# Patient Record
Sex: Female | Born: 1970 | Race: White | Hispanic: No | Marital: Married | Smoking: Current every day smoker
Health system: Southern US, Community
[De-identification: ages and names within clinical notes are randomized; demographics above are authoritative.]

## PROBLEM LIST (undated history)

## (undated) HISTORY — PX: ABDOMINAL HYSTERECTOMY: SHX81

---

## 2014-12-21 ENCOUNTER — Encounter (HOSPITAL_COMMUNITY): Payer: Self-pay

## 2014-12-21 ENCOUNTER — Emergency Department (HOSPITAL_COMMUNITY): Payer: BLUE CROSS/BLUE SHIELD

## 2014-12-21 ENCOUNTER — Emergency Department (HOSPITAL_COMMUNITY)
Admission: EM | Admit: 2014-12-21 | Discharge: 2014-12-22 | Disposition: A | Discharge status: Home / Self Care | Payer: BLUE CROSS/BLUE SHIELD | Attending: Emergency Medicine | Admitting: Emergency Medicine

## 2014-12-21 DIAGNOSIS — Z720 Tobacco use: Secondary | ICD-10-CM | POA: Insufficient documentation

## 2014-12-21 DIAGNOSIS — R079 Chest pain, unspecified: Secondary | ICD-10-CM

## 2014-12-21 DIAGNOSIS — D72829 Elevated white blood cell count, unspecified: Secondary | ICD-10-CM | POA: Insufficient documentation

## 2014-12-21 DIAGNOSIS — R590 Localized enlarged lymph nodes: Secondary | ICD-10-CM | POA: Insufficient documentation

## 2014-12-21 DIAGNOSIS — R0789 Other chest pain: Secondary | ICD-10-CM | POA: Diagnosis not present

## 2014-12-21 DIAGNOSIS — R591 Generalized enlarged lymph nodes: Secondary | ICD-10-CM

## 2014-12-21 DIAGNOSIS — Z7982 Long term (current) use of aspirin: Secondary | ICD-10-CM | POA: Diagnosis not present

## 2014-12-21 LAB — CBC WITH DIFFERENTIAL/PLATELET
Basophils Absolute: 0 10*3/uL (ref 0.0–0.1)
Basophils Relative: 0 % (ref 0–1)
Eosinophils Absolute: 0.1 10*3/uL (ref 0.0–0.7)
Eosinophils Relative: 1 % (ref 0–5)
HEMATOCRIT: 41.9 % (ref 36.0–46.0)
HEMOGLOBIN: 14.2 g/dL (ref 12.0–15.0)
LYMPHS PCT: 24 % (ref 12–46)
Lymphs Abs: 4.4 10*3/uL — ABNORMAL HIGH (ref 0.7–4.0)
MCH: 28.8 pg (ref 26.0–34.0)
MCHC: 33.9 g/dL (ref 30.0–36.0)
MCV: 85 fL (ref 78.0–100.0)
MONOS PCT: 4 % (ref 3–12)
Monocytes Absolute: 0.8 10*3/uL (ref 0.1–1.0)
Neutro Abs: 13.1 10*3/uL — ABNORMAL HIGH (ref 1.7–7.7)
Neutrophils Relative %: 71 % (ref 43–77)
Platelets: 352 10*3/uL (ref 150–400)
RBC: 4.93 MIL/uL (ref 3.87–5.11)
RDW: 13.5 % (ref 11.5–15.5)
WBC: 18.5 10*3/uL — AB (ref 4.0–10.5)

## 2014-12-21 LAB — TROPONIN I

## 2014-12-21 LAB — BASIC METABOLIC PANEL
Anion gap: 7 (ref 5–15)
BUN: 17 mg/dL (ref 6–23)
CO2: 25 mmol/L (ref 19–32)
Calcium: 8.9 mg/dL (ref 8.4–10.5)
Chloride: 104 mmol/L (ref 96–112)
Creatinine, Ser: 0.99 mg/dL (ref 0.50–1.10)
GFR calc Af Amer: 80 mL/min — ABNORMAL LOW (ref >90)
GFR calc non Af Amer: 69 mL/min — ABNORMAL LOW (ref >90)
GLUCOSE: 101 mg/dL — AB (ref 70–99)
Potassium: 4.9 mmol/L (ref 3.5–5.1)
Sodium: 136 mmol/L (ref 135–145)

## 2014-12-21 MED ORDER — SODIUM CHLORIDE 0.9 % IV SOLN
INTRAVENOUS | Status: DC
Start: 2014-12-21 — End: 2014-12-22
  Administered 2014-12-21: 20 mL/h via INTRAVENOUS

## 2014-12-21 NOTE — ED Notes (Signed)
EKG given to EDP,Allen,MD., for review. 

## 2014-12-21 NOTE — ED Notes (Signed)
Patient reports she has recurrent episodes of sharp chest pain.  Today she has had several episodes of sharp left-sided chest pain with radiation into neck and down left arm.

## 2014-12-22 LAB — TROPONIN I

## 2014-12-22 LAB — D-DIMER, QUANTITATIVE: D-Dimer, Quant: <0.27 ug/mL-FEU (ref 0.00–0.48)

## 2014-12-22 NOTE — Discharge Instructions (Signed)
Your workup for your chest pain did not show a specific cause today.  It was noticed that she had some swollen lymph nodes along her left neck and in your left armpit.  This is most likely reactive to the recent puppy bite and scratches to your left arm.  Your white blood cell count was also elevated.  No acute infection was noted to the scratches.  We are not treating with antibiotics at this time.  Please follow-up next week with your primary care doctor for reevaluation of these findings.    Chest Pain Observation It is often hard to give a specific diagnosis for the cause of chest pain. Among other possibilities your symptoms might be caused by inadequate oxygen delivery to your heart (angina). Angina that is not treated or evaluated can lead to a heart attack (myocardial infarction) or death. Blood tests, electrocardiograms, and X-rays may have been done to help determine a possible cause of your chest pain. After evaluation and observation, your health care provider has determined that it is unlikely your pain was caused by an unstable condition that requires hospitalization. However, a full evaluation of your pain may need to be completed, with additional diagnostic testing as directed. It is very important to keep your follow-up appointments. Not keeping your follow-up appointments could result in permanent heart damage, disability, or death. If there is any problem keeping your follow-up appointments, you must call your health care provider. HOME CARE INSTRUCTIONS  Due to the slight chance that your pain could be angina, it is important to follow your health care provider's treatment plan and also maintain a healthy lifestyle:  Maintain or work toward achieving a healthy weight.  Stay physically active and exercise regularly.  Decrease your salt intake.  Eat a balanced, healthy diet. Talk to a dietitian to learn about heart-healthy foods.  Increase your fiber intake by including whole  grains, vegetables, fruits, and nuts in your diet.  Avoid situations that cause stress, anger, or depression.  Take medicines as advised by your health care provider. Report any side effects to your health care provider. Do not stop medicines or adjust the dosages on your own.  Quit smoking. Do not use nicotine patches or gum until you check with your health care provider.  Keep your blood pressure, blood sugar, and cholesterol levels within normal limits.  Limit alcohol intake to no more than 1 drink per day for women who are not pregnant and 2 drinks per day for men.  Do not abuse drugs. SEEK IMMEDIATE MEDICAL CARE IF: You have severe chest pain or pressure which may include symptoms such as:  You feel pain or pressure in your arms, neck, jaw, or back.  You have severe back or abdominal pain, feel sick to your stomach (nauseous), or throw up (vomit).  You are sweating profusely.  You are having a fast or irregular heartbeat.  You feel short of breath while at rest.  You notice increasing shortness of breath during rest, sleep, or with activity.  You have chest pain that does not get better after rest or after taking your usual medicine.  You wake from sleep with chest pain.  You are unable to sleep because you cannot breathe.  You develop a frequent cough or you are coughing up blood.  You feel dizzy, faint, or experience extreme fatigue.  You develop severe weakness, dizziness, fainting, or chills. Any of these symptoms may represent a serious problem that is an emergency. Do not wait  to see if the symptoms will go away. Call your local emergency services (911 in the [place] Do not drive yourself to the hospital. MAKE SURE YOU:  Understand these instructions.  Will watch your condition.  Will get help right away if you are not doing well or get worse. Document Released: 12/06/2010 Document Revised: 11/08/2013 Document Reviewed: 05/05/2013 [facility] Patient  Information 2015 [place], [place]. This information is not intended to replace advice given to you by your health care provider. Make sure you discuss any questions you have with your health care provider.  Lymphadenopathy Lymphadenopathy means "disease of the lymph glands." But the term is usually used to describe swollen or enlarged lymph glands, also called lymph nodes. These are the bean-shaped organs found in many locations including the neck, underarm, and groin. Lymph glands are part of the immune system, which fights infections in your body. Lymphadenopathy can occur in just one area of the body, such as the neck, or it can be generalized, with lymph node enlargement in several areas. The nodes found in the neck are the most common sites of lymphadenopathy. CAUSES When your immune system responds to germs (such as viruses or bacteria ), infection-fighting cells and fluid build up. This causes the glands to grow in size. Usually, this is not something to worry about. Sometimes, the glands themselves can become infected and inflamed. This is called lymphadenitis. Enlarged lymph nodes can be caused by many diseases:  Bacterial disease, such as strep throat or a skin infection.  Viral disease, such as a common cold.  Other germs, such as Lyme disease, tuberculosis, or sexually transmitted diseases.  Cancers, such as lymphoma (cancer of the lymphatic system) or leukemia (cancer of the white blood cells).  Inflammatory diseases such as lupus or rheumatoid arthritis.  Reactions to medications. Many of the diseases above are rare, but important. This is why you should see your caregiver if you have lymphadenopathy. SYMPTOMS  Swollen, enlarged lumps in the neck, back of the head, or other locations.  Tenderness.  Warmth or redness of the skin over the lymph nodes.  Fever. DIAGNOSIS Enlarged lymph nodes are often near the source of infection. They can help health care providers diagnose  your illness. For instance:  Swollen lymph nodes around the jaw might be caused by an infection in the mouth.  Enlarged glands in the neck often signal a throat infection.  Lymph nodes that are swollen in more than one area often indicate an illness caused by a virus. Your caregiver will likely know what is causing your lymphadenopathy after listening to your history and examining you. Blood tests, x-rays, or other tests may be needed. If the cause of the enlarged lymph node cannot be found, and it does not go away by itself, then a biopsy may be needed. Your caregiver will discuss this with you. TREATMENT Treatment for your enlarged lymph nodes will depend on the cause. Many times the nodes will shrink to normal size by themselves, with no treatment. Antibiotics or other medicines may be needed for infection. Only take over-the-counter or prescription medicines for pain, discomfort, or fever as directed by your caregiver. HOME CARE INSTRUCTIONS Swollen lymph glands usually return to normal when the underlying medical condition goes away. If they persist, contact your health-care provider. He/she might prescribe antibiotics or other treatments, depending on the diagnosis. Take any medications exactly as prescribed. Keep any follow-up appointments made to check on the condition of your enlarged nodes. SEEK MEDICAL CARE IF:  Swelling lasts for more than two weeks.  You have symptoms such as weight loss, night sweats, fatigue, or fever that does not go away.  The lymph nodes are hard, seem fixed to the skin, or are growing rapidly.  Skin over the lymph nodes is red and inflamed. This could mean there is an infection. SEEK IMMEDIATE MEDICAL CARE IF:  Fluid starts leaking from the area of the enlarged lymph node.  You develop a fever of 102 F (38.9 C) or greater.  Severe pain develops (not necessarily at the site of a large lymph node).  You develop chest pain or shortness of breath.  You  develop worsening abdominal pain. MAKE SURE YOU:  Understand these instructions.  Will watch your condition.  Will get help right away if you are not doing well or get worse. Document Released: 08/12/2008 Document Revised: 03/20/2014 Document Reviewed: 08/12/2008 [facility] Patient Information 2015 [place], [place]. This information is not intended to replace advice given to you by your health care provider. Make sure you discuss any questions you have with your health care provider.  Leukocytosis Leukocytosis means you have more white blood cells than normal. White blood cells are made in your bone marrow. The main job of white blood cells is to fight infection. Having too many white blood cells is a common condition. It can develop as a result of many types of medical problems. CAUSES  In some cases, your bone marrow may be normal, but it is still making too many white blood cells. This could be the result of:  Infection.  Injury.  Physical stress.  Emotional stress.  Surgery.  Allergic reactions.  Tumors that do not start in the blood or bone marrow.  An inherited disease.  Certain medicines.  Pregnancy and labor. In other cases, you may have a bone marrow disorder that is causing your body to make too many white blood cells. Bone marrow disorders include:  Leukemia. This is a type of blood cancer.  Myeloproliferative disorders. These disorders cause blood cells to grow abnormally. SYMPTOMS  Some people have no symptoms. Others have symptoms due to the medical problem that is causing their leukocytosis. These symptoms may include:  Bleeding.  Bruising.  Fever.  Night sweats.  Repeated infections.  Weakness.  Weight loss. DIAGNOSIS  Leukocytosis is often found during blood tests that are done as part of a normal physical exam. Your caregiver will probably order other tests to help determine why you have too many white blood cells. These tests may include:  A  complete blood count (CBC). This test measures all the types of blood cells in your body.  Chest X-rays, urine tests (urinalysis), or other tests to look for signs of infection.  Bone marrow aspiration. For this test, a needle is put into your bone. Cells from the bone marrow are removed through the needle. The cells are then examined under a microscope. TREATMENT  Treatment is usually not needed for leukocytosis. However, if a disorder is causing your leukocytosis, it will need to be treated. Treatment may include:  Antibiotic medicines if you have a bacterial infection.  Bone marrow transplant. Your diseased bone marrow is replaced with healthy cells that will grow new bone marrow.  Chemotherapy. This is the use of drugs to kill cancer cells. HOME CARE INSTRUCTIONS  Only take over-the-counter or prescription medicines as directed by your caregiver.  Maintain a healthy weight. Ask your caregiver what weight is best for you.  Eat foods that are  low in saturated fats and high in fiber. Eat plenty of fruits and vegetables.  Drink enough fluids to keep your urine clear or pale yellow.  Get 30 minutes of exercise at least 5 times a week. Check with your caregiver before starting a new exercise routine.  Limit caffeine and alcohol.  Do not smoke.  Keep all follow-up appointments as directed by your caregiver. SEEK MEDICAL CARE IF:  You feel weak or more tired than usual.  You develop chills, a cough, or nasal congestion.  You lose weight without trying.  You have night sweats.  You bruise easily. SEEK IMMEDIATE MEDICAL CARE IF:  You bleed more than normal.  You have chest pain.  You have trouble breathing.  You have a fever.  You have uncontrolled nausea or vomiting.  You feel dizzy or lightheaded. MAKE SURE YOU:  Understand these instructions.  Will watch your condition.  Will get help right away if you are not doing well or get worse. Document Released:  10/23/2011 Document Revised: 01/26/2012 Document Reviewed: 10/23/2011 [facility] Patient Information 2015 [place], [place]. This information is not intended to replace advice given to you by your health care provider. Make sure you discuss any questions you have with your health care provider.

## 2014-12-22 NOTE — ED Provider Notes (Signed)
CSN: [id number]     Arrival date & time 12/21/14  2210 History   First MD Initiated Contact with Patient 12/21/14 2307     Chief Complaint  Patient presents with  . Chest Pain     (Consider location/radiation/quality/duration/timing/severity/associated sxs/prior Treatment) HPI 44 year old female presents to the emergency department with complaint of left-sided chest pain.  She reports the pain is sharp, lasting a few seconds of the time.  It is gone to the left side her neck and down her left arm.  She denies any diaphoresis, shortness of breath, nausea with this pain.  Symptoms currently resolved.  Lasted about 2 hours total.  Patient reports she has had similar problems in the past, and had workup that showed fast heart rate.  She was unable to do a stress test secondary to tachycardia.  She denies any family history of coronary disease.  She is a smoker.  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  Patient noted to have scratches to her arms, left greater than right.  She reports that she has 3 puppies that have been scratching her.  She denies any fever, chills, rashes, or swelling. History reviewed. No pertinent past medical history. Past Surgical History  Procedure Laterality Date  . Abdominal hysterectomy     History reviewed. No pertinent family history. History  Substance Use Topics  . Smoking status: Current Every Day Smoker -- 1.00 packs/day    Types: Cigarettes  . Smokeless tobacco: Not on file  . Alcohol Use: No   OB History    No data available     Review of Systems   See History of Present Illness; otherwise all other systems are reviewed and negative  Allergies  Review of patient's allergies indicates no known allergies.  Home Medications   Prior to Admission medications   Medication Sig Start Date End Date Taking? Authorizing Provider  aspirin EC 81 MG tablet Take 81 mg by mouth daily.   Yes Historical Provider, MD   BP 128/76 mmHg  Pulse 88   Temp(Src) 97.9 F (36.6 C) (Oral)  Resp 22  Ht 5\' 7"  (1.702 m)  Wt 198 lb (89.812 kg)  BMI 31.00 kg/m2  SpO2 97% Physical Exam  Constitutional: She is oriented to person, place, and time. She appears well-developed and well-nourished. No distress.  HENT:  Head: Normocephalic and atraumatic.  Nose: Nose normal.  Mouth/Throat: Oropharynx is clear and moist.  Eyes: Conjunctivae and EOM are normal. Pupils are equal, round, and reactive to light.  Neck: Normal range of motion. Neck supple. No JVD present. No tracheal deviation present. No thyromegaly present.  Cardiovascular: Normal rate, regular rhythm, normal heart sounds and intact distal pulses.  Exam reveals no gallop and no friction rub.   No murmur heard. Pulmonary/Chest: Effort normal and breath sounds normal. No stridor. No respiratory distress. She has no wheezes. She has no rales. She exhibits no tenderness.  Abdominal: Soft. Bowel sounds are normal. She exhibits no distension and no mass. There is no tenderness. There is no rebound and no guarding.  Musculoskeletal: Normal range of motion. She exhibits no edema or tenderness.  Patient has several scratches to both arms, 3 or 4 to the left arm with some mild surrounding erythema without purulence, crepitus, or frank cellulitis.  Lymphadenopathy:    She has cervical adenopathy (patient has posterior chain lymphadenopathy along the left SCM.  She also has left axillary lymphadenopathy).  Neurological: She is alert and oriented  to person, place, and time. She displays normal reflexes. She exhibits normal muscle tone. Coordination normal.  Skin: Skin is warm and dry. No rash noted. No erythema. No pallor.  Psychiatric: She has a normal mood and affect. Her behavior is normal. Judgment and thought content normal.  Nursing note and vitals reviewed.   ED Course  Procedures (including critical care time) Labs Review Labs Reviewed  CBC WITH DIFFERENTIAL/PLATELET - Abnormal; Notable  for the following:    WBC 18.5 (*)    Neutro Abs 13.1 (*)    Lymphs Abs 4.4 (*)    All other components within normal limits  BASIC METABOLIC PANEL - Abnormal; Notable for the following:    Glucose, Bld 101 (*)    GFR calc non Af Amer 69 (*)    GFR calc Af Amer 80 (*)    All other components within normal limits  TROPONIN I  TROPONIN I  D-DIMER, QUANTITATIVE    Imaging Review Dg Chest 2 View  12/21/2014   CLINICAL DATA:  Recurrent sharp chest pain, on the left side, radiating to the left arm and neck.  EXAM: CHEST  2 VIEW  COMPARISON:  None.  FINDINGS: The lungs are well-aerated. Minimal bibasilar atelectasis is suggested. There is no evidence of pleural effusion or pneumothorax.  The heart is normal in size; the mediastinal contour is within normal limits. No acute osseous abnormalities are seen.  IMPRESSION: Minimal bibasilar atelectasis suggested; lungs otherwise clear. No displaced rib fracture seen.   Electronically Signed   By: Roanna Raider M.D.   On: 12/21/2014 22:52     EKG Interpretation   Date/Time:  Thursday December 21 2014 22:49:53 EST Ventricular Rate:  81 PR Interval:  155 QRS Duration: 103 QT Interval:  379 QTC Calculation: 440 R Axis:   85 Text Interpretation:  Sinus rhythm No old tracing to compare Confirmed by  Jahnay Lantier  MD, Dari Carpenito ([id number]) on 12/21/2014 11:19:03 PM      MDM   Final diagnoses:  Atypical chest pain  Lymphadenopathy  Leukocytosis    43 year old female with chest pain that is sharp and brief in nature.  Pain by the patient's description is very atypical for ACS.  D-dimer is negative, and again pain does not seem consistent with a PE.  She was noted to have some lymphadenopathy in leukocytosis.  This may be secondary to reactive changes from dog bites and scratches.  She does not appear to have any active skin infections.  We'll hold on antibiotics at this time.  Patient reports that she has follow-up with her doctor scheduled for next  Wednesday.    Olivia Mackie, MD 12/22/14 Earle [facility]ell

## 2015-08-26 IMAGING — CR DG CHEST 2V
2 series · 2 of 2 positions shown · non-contrast
Comparison: None.

CLINICAL DATA: Recurrent sharp chest pain, on the left side,
radiating to the left arm and neck.

EXAM:
CHEST  2 VIEW

[w chest pa]
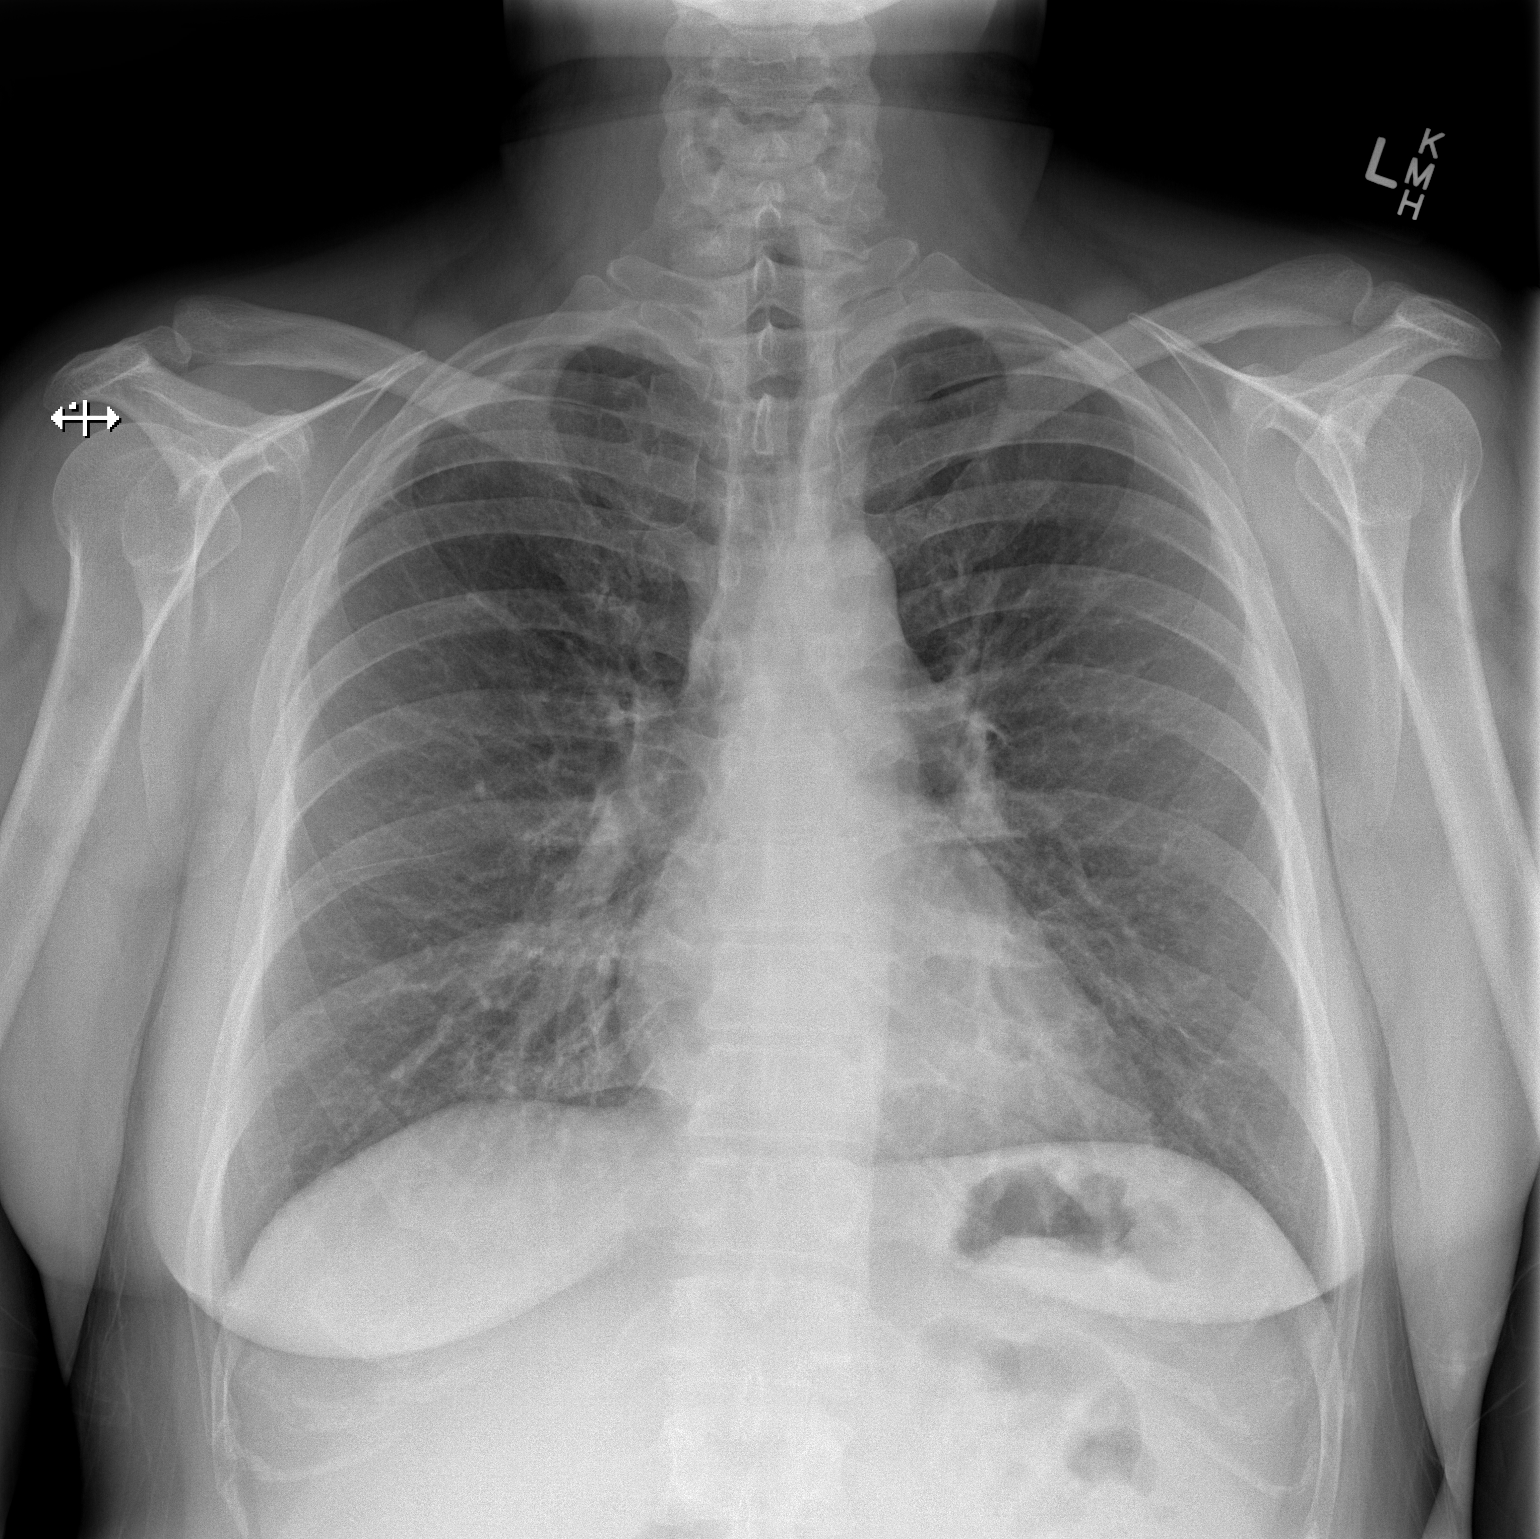

[w chest lat]
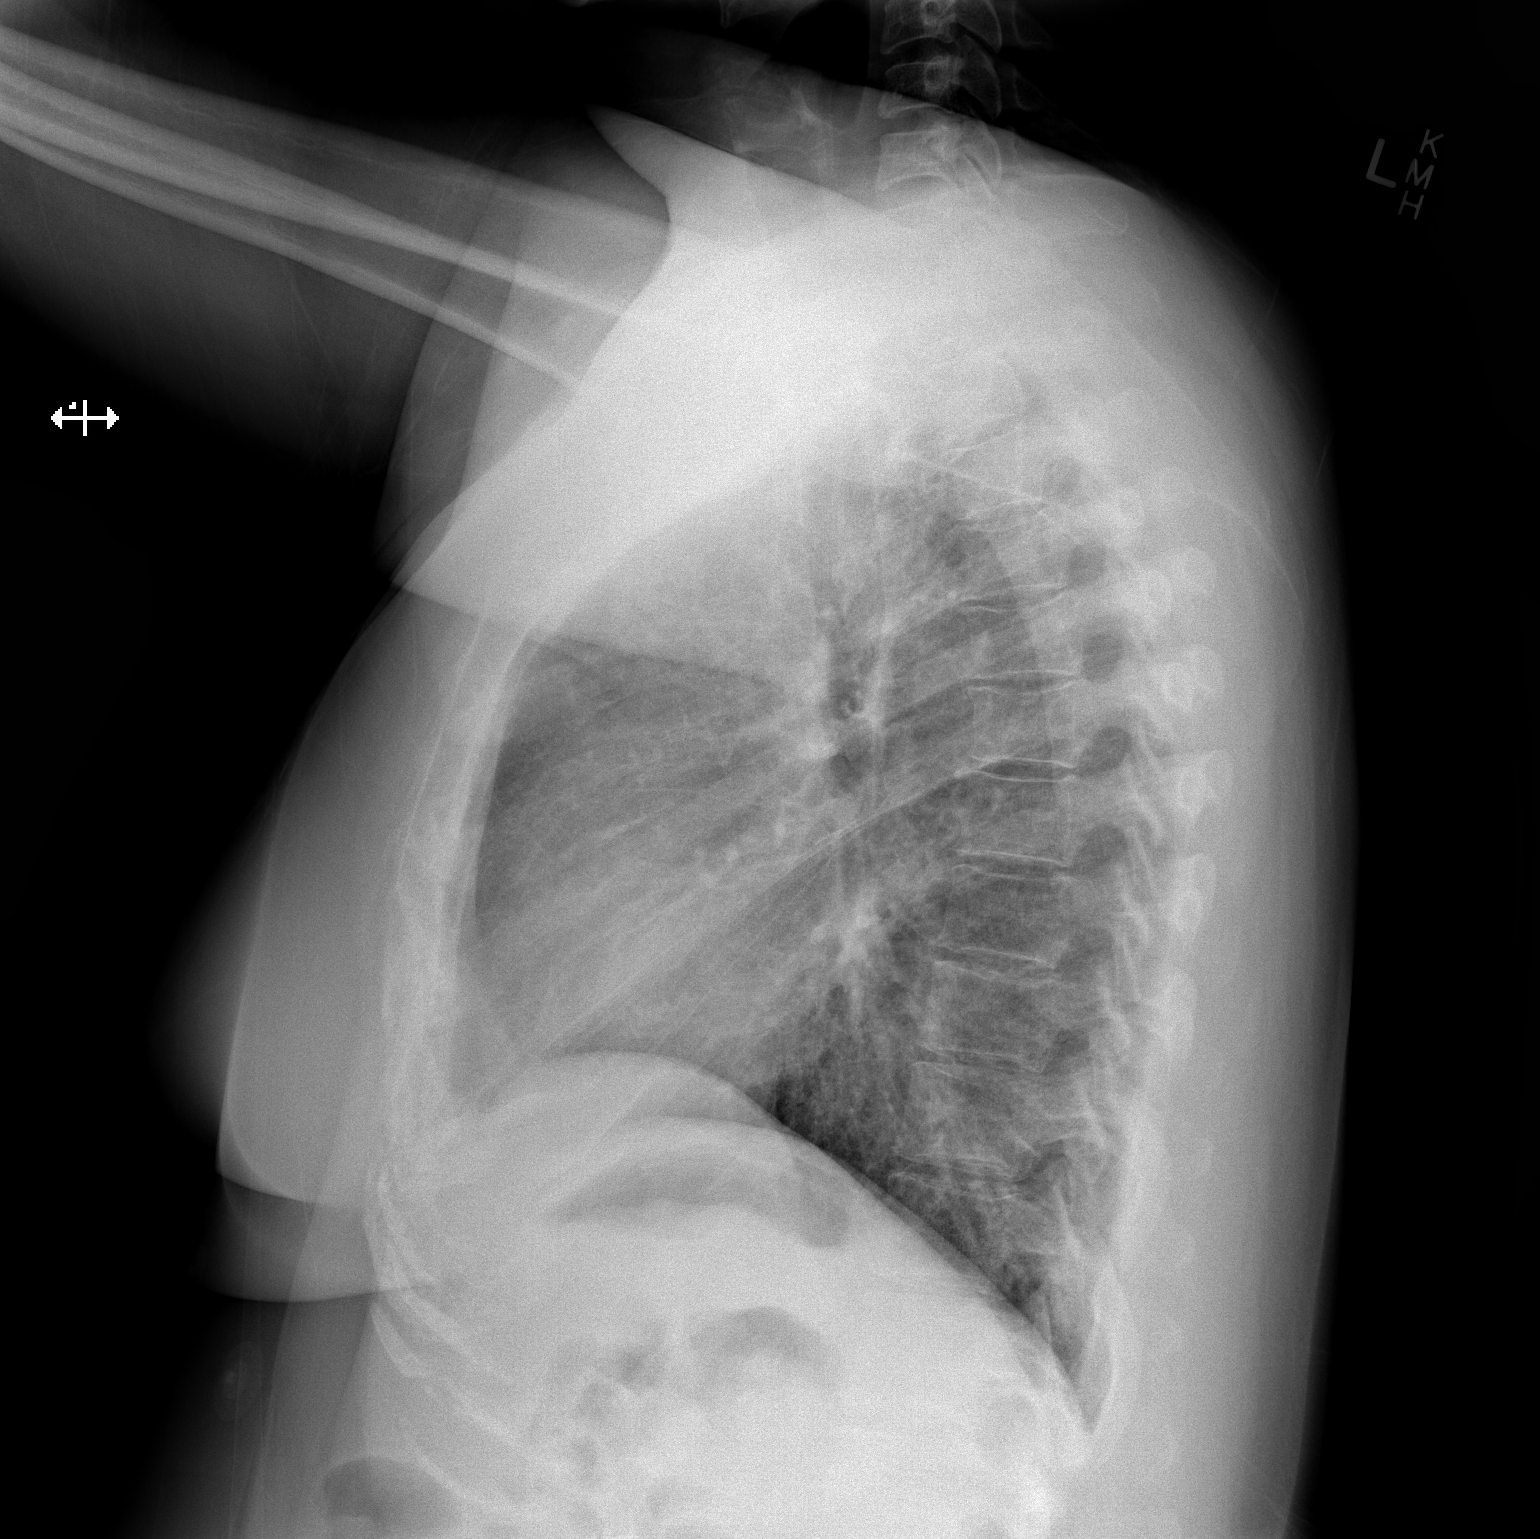

[2 of 2 positions shown; findings below may reference images not displayed]

FINDINGS: The lungs are well-aerated. Minimal bibasilar atelectasis is
suggested. There is no evidence of pleural effusion or pneumothorax.

The heart is normal in size; the mediastinal contour is within
normal limits. No acute osseous abnormalities are seen.
IMPRESSION: Minimal bibasilar atelectasis suggested; lungs otherwise clear. No
displaced rib fracture seen.
